# Patient Record
Sex: Male | Born: 1945 | Race: White | Hispanic: No | Marital: Single | Smoking: Never smoker
Health system: Southern US, Community
[De-identification: ages and names within clinical notes are randomized; demographics above are authoritative.]

## PROBLEM LIST (undated history)

## (undated) HISTORY — PX: FRACTURE SURGERY: SHX138

## (undated) HISTORY — PX: TONSILLECTOMY: SUR1361

## (undated) HISTORY — PX: BACK SURGERY: SHX140

## (undated) HISTORY — PX: KNEE SURGERY: SHX244

---

## 2000-05-23 ENCOUNTER — Ambulatory Visit (HOSPITAL_COMMUNITY): Admission: RE | Admit: 2000-05-23 | Discharge: 2000-05-23 | Payer: Self-pay | Admitting: Neurosurgery

## 2000-05-23 ENCOUNTER — Encounter: Payer: Self-pay | Admitting: Neurosurgery

## 2000-06-14 ENCOUNTER — Encounter: Payer: Self-pay | Admitting: Neurosurgery

## 2000-06-19 ENCOUNTER — Ambulatory Visit (HOSPITAL_COMMUNITY): Admission: RE | Admit: 2000-06-19 | Discharge: 2000-06-20 | Payer: Self-pay | Admitting: Neurosurgery

## 2000-06-19 ENCOUNTER — Encounter: Payer: Self-pay | Admitting: Neurosurgery

## 2011-12-07 DIAGNOSIS — M19031 Primary osteoarthritis, right wrist: Secondary | ICD-10-CM | POA: Insufficient documentation

## 2012-01-08 DIAGNOSIS — K219 Gastro-esophageal reflux disease without esophagitis: Secondary | ICD-10-CM | POA: Insufficient documentation

## 2012-01-08 DIAGNOSIS — N529 Male erectile dysfunction, unspecified: Secondary | ICD-10-CM | POA: Insufficient documentation

## 2016-02-17 DIAGNOSIS — M19041 Primary osteoarthritis, right hand: Secondary | ICD-10-CM | POA: Insufficient documentation

## 2016-02-17 DIAGNOSIS — G5601 Carpal tunnel syndrome, right upper limb: Secondary | ICD-10-CM | POA: Insufficient documentation

## 2016-02-28 DEATH — deceased

## 2016-03-14 DIAGNOSIS — M67441 Ganglion, right hand: Secondary | ICD-10-CM | POA: Insufficient documentation

## 2019-12-21 DIAGNOSIS — M170 Bilateral primary osteoarthritis of knee: Secondary | ICD-10-CM | POA: Insufficient documentation

## 2019-12-21 DIAGNOSIS — Z87891 Personal history of nicotine dependence: Secondary | ICD-10-CM | POA: Insufficient documentation

## 2020-06-10 ENCOUNTER — Emergency Department
Admission: RE | Admit: 2020-06-10 | Discharge: 2020-06-10 | Disposition: A | Discharge status: Home / Self Care | Payer: Medicare Other | Source: Ambulatory Visit | Attending: Family Medicine | Admitting: Family Medicine

## 2020-06-10 ENCOUNTER — Emergency Department (INDEPENDENT_AMBULATORY_CARE_PROVIDER_SITE_OTHER): Payer: Medicare Other

## 2020-06-10 ENCOUNTER — Other Ambulatory Visit: Payer: Self-pay

## 2020-06-10 VITALS — BP 161/94 | HR 85 | Temp 99.0°F | Resp 16 | Ht 71.0 in | Wt 200.0 lb

## 2020-06-10 DIAGNOSIS — J069 Acute upper respiratory infection, unspecified: Secondary | ICD-10-CM | POA: Diagnosis not present

## 2020-06-10 DIAGNOSIS — R059 Cough, unspecified: Secondary | ICD-10-CM | POA: Diagnosis not present

## 2020-06-10 DIAGNOSIS — J209 Acute bronchitis, unspecified: Secondary | ICD-10-CM

## 2020-06-10 MED ORDER — DOXYCYCLINE HYCLATE 100 MG PO CAPS: ORAL_CAPSULE | ORAL | 0 refills | Status: DC

## 2020-06-10 NOTE — ED Triage Notes (Addendum)
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

## 2020-06-10 NOTE — ED Provider Notes (Signed)
[facility]    CSN: [id number] Arrival date & time: 06/10/20  1835      History   Chief Complaint Chief Complaint  Patient presents with  . Appointment  . Facial Pain    HPI Thomas Bauer is a 74 y.o. male.   One week ago patient developed sinus congestion and post-nasal drainage, followed by a partly productive cough.  He has had chills/sweats and now has developed mild pain at the bases of his anterior chest.  The history is provided by the patient.    History reviewed. No pertinent past medical history.  Patient Active Problem List   Diagnosis Date Noted  . Personal history of smoking 12/21/2019  . Primary osteoarthritis of both knees 12/21/2019  . Ganglion cyst of flexor tendon sheath of finger of right hand 03/14/2016  . Carpal tunnel syndrome of right wrist 02/17/2016  . Primary localized osteoarthrosis of right hand 02/17/2016  . Erectile dysfunction 01/08/2012  . GERD (gastroesophageal reflux disease) 01/08/2012  . 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 BACK SURGERY    . FRACTURE SURGERY Left    wrist   . FRACTURE SURGERY Right    jaw  . KNEE SURGERY Left   . KNEE SURGERY Right   . TONSILLECTOMY         Home Medications    Prior to Admission medications   Medication Sig Start Date End Date Taking? Authorizing Provider  latanoprost (XALATAN) 0.005 % ophthalmic solution Place 1 drop into both eyes at bedtime. 04/07/20  Yes [provider]  omeprazole (PRILOSEC) 20 MG capsule TAKE 2 CAPSULES BY MOUTH ONCE A DAY 03/30/20  Yes [provider]  sildenafil (REVATIO) 20 MG tablet 2-3 tabs 1 hr before intercourse 12/21/19  Yes [provider]  doxycycline (VIBRAMYCIN) 100 MG capsule Take one cap PO Q12hr with food. 06/10/20 06/23/20  Lattie Haw, MD    Family History Family History  Problem Relation Age of Onset  . Cancer Sister     Social History Social  History   Tobacco Use  . Smoking status: Never Smoker  . Smokeless tobacco: Never Used  Vaping Use  . Vaping Use: Never used  Substance Use Topics  . Alcohol use: Yes    Alcohol/week: 6.0 standard drinks    Types: 6 Cans of beer per week  . Drug use: Never     Allergies   Aspirin   Review of Systems Review of Systems No sore throat + cough ? pleuritic pain; patient complains of pain in anterior lower chest No wheezing + nasal congestion + post-nasal drainage No sinus pain/pressure No itchy/red eyes No earache No hemoptysis No SOB No fever/+ chills,sweats No nausea No vomiting No abdominal pain No diarrhea No urinary symptoms No skin rash + fatigue No myalgias No headache Used OTC meds (Nyquil) without relief   Physical Exam Triage Vital Signs ED Triage Vitals  Enc Vitals Group     BP 06/10/20 1859 (!) 161/94     Pulse Rate 06/10/20 1859 85     Resp 06/10/20 1859 16     Temp 06/10/20 1859 99 F (37.2 C)     Temp Source 06/10/20 1859 Oral     SpO2 06/10/20 1859 97 %     Weight 06/10/20 1904 200 lb (90.7 kg)     Height 06/10/20 1904 5\' 11"  (1.803 m)     Head Circumference --  Peak Flow --      Pain Score 06/10/20 1903 3     Pain Loc --      Pain Edu? --      Excl. in GC? --    No data found.  Updated Vital Signs BP (!) 161/94 (BP Location: Right Arm)   Pulse 85   Temp 99 F (37.2 C) (Oral)   Resp 16   Ht 5\' 11"  (1.803 m)   Wt 90.7 kg   SpO2 97%   BMI 27.89 kg/m   Visual Acuity Right Eye Distance:   Left Eye Distance:   Bilateral Distance:    Right Eye Near:   Left Eye Near:    Bilateral Near:     Physical Exam Nursing notes and Vital Signs reviewed. Appearance:  Patient appears stated age, and in no acute distress Eyes:  Pupils are equal, round, and reactive to light and accomodation.  Extraocular movement is intact.  Conjunctivae are not inflamed  Ears:  Canals normal.  Tympanic membranes normal.  Nose:  Mildly congested  turbinates.  No sinus tenderness.   Pharynx:  Normal Neck:  Supple.  Mildly enlarged lateral nodes are present, tender to palpation on the left.   Lungs:  Clear to auscultation.  Breath sounds are equal.  Moving air well. Heart:  Regular rate and rhythm without murmurs, rubs, or gallops.  Abdomen:  Nontender without masses or hepatosplenomegaly.  Bowel sounds are present.  No CVA or flank tenderness.  Extremities:  No edema.  Skin:  No rash present.   UC Treatments / Results  Labs (all labs ordered are listed, but only abnormal results are displayed) Labs Reviewed - No data to display  EKG   Radiology DG Chest 2 View  Result Date: 06/10/2020 CLINICAL DATA:  URI chest pain and cough EXAM: CHEST - 2 VIEW COMPARISON:  None. FINDINGS: Mild bronchitic changes. No consolidation or effusion. Normal heart size. Aortic atherosclerosis. Suspected thin walled cyst or large bulla in the peripheral left upper lobe. IMPRESSION: Mild bronchitic changes. No focal pulmonary infiltrate. Suspected large bulla or thin walled cyst in the left upper lobe. Electronically Signed   By: 13/06/2020 M.D.   On: 06/10/2020 20:33    Procedures Procedures (including critical care time)  Medications Ordered in UC Medications - No data to display  Initial Impression / Assessment and Plan / UC Course  I have reviewed the triage vital signs and the nursing notes.  Pertinent labs & imaging results that were available during my care of the patient were reviewed by me and considered in my medical decision making (see chart for details).    Begin doxycycline. Followup with Family Doctor if not improved in about 10 days.   Final Clinical Impressions(s) / UC Diagnoses   Final diagnoses:  Acute bronchitis, unspecified organism     Discharge Instructions     Take plain guaifenesin (1200mg  extended release tabs such as Mucinex) twice daily, with plenty of water, for cough and congestion.  May add  Pseudoephedrine (30mg , one or two every 4 to 6 hours) for sinus congestion.  Get adequate rest.   May use Afrin nasal spray (or generic oxymetazoline) each morning for about 5 days and then discontinue.  Also recommend using saline nasal spray several times daily and saline nasal irrigation (AYR is a common brand).  Use Flonase nasal spray each morning after using Afrin nasal spray and saline nasal irrigation. Try warm salt water gargles for sore throat.  Stop all antihistamines (Nyquil, etc) for now, and other non-prescription cough/cold preparations. May take Ibuprofen 200mg , 4 tabs every 8 hours with food body aches, headache, etc. May take Delsym Cough Suppressant ("12 Hour Cough Relief") at bedtime for nighttime cough.     ED Prescriptions    Medication Sig Dispense Auth. Provider   doxycycline (VIBRAMYCIN) 100 MG capsule Take one cap PO Q12hr with food. 20 capsule , MD        Lattie Haw, MD 06/12/20 2019

## 2020-06-10 NOTE — Discharge Instructions (Addendum)
Take plain guaifenesin (1200mg  extended release tabs such as Mucinex) twice daily, with plenty of water, for cough and congestion.  May add Pseudoephedrine (30mg , one or two every 4 to 6 hours) for sinus congestion.  Get adequate rest.   May use Afrin nasal spray (or generic oxymetazoline) each morning for about 5 days and then discontinue.  Also recommend using saline nasal spray several times daily and saline nasal irrigation (AYR is a common brand).  Use Flonase nasal spray each morning after using Afrin nasal spray and saline nasal irrigation. Try warm salt water gargles for sore throat.  Stop all antihistamines (Nyquil, etc) for now, and other non-prescription cough/cold preparations. May take Ibuprofen 200mg , 4 tabs every 8 hours with food body aches, headache, etc. May take Delsym Cough Suppressant ("12 Hour Cough Relief") at bedtime for nighttime cough.

## 2020-06-16 ENCOUNTER — Encounter: Payer: Self-pay | Admitting: Emergency Medicine

## 2020-06-16 ENCOUNTER — Other Ambulatory Visit: Payer: Self-pay

## 2020-06-16 ENCOUNTER — Emergency Department
Admission: EM | Admit: 2020-06-16 | Discharge: 2020-06-16 | Disposition: A | Discharge status: Home / Self Care | Payer: Medicare Other | Source: Home / Self Care

## 2020-06-16 DIAGNOSIS — M109 Gout, unspecified: Secondary | ICD-10-CM

## 2020-06-16 MED ORDER — COLCHICINE 0.6 MG PO TABS: ORAL_TABLET | ORAL | 0 refills | Status: AC

## 2020-06-16 MED ORDER — PREDNISONE 50 MG PO TABS: 50.0000 mg | ORAL_TABLET | Freq: Every day | ORAL | 0 refills | Status: AC

## 2020-06-16 MED ORDER — HYDROCODONE-ACETAMINOPHEN 5-325 MG PO TABS
1.0000 | ORAL_TABLET | Freq: Four times a day (QID) | ORAL | 0 refills | Status: AC | PRN
Start: 2020-06-16 — End: ?

## 2020-06-16 NOTE — ED Provider Notes (Signed)
[facility]    CSN: [id number] Arrival date & time: 06/16/20  1015      History   Chief Complaint Chief Complaint  Patient presents with  . Gout    HPI Thomas Bauer is a 74 y.o. male.   HPI Thomas Bauer is a 74 y.o. male presenting to UC with c/o 3.5 days of gradually worsening aching pain in his Left great toe. Associated redness. Pain is worst in the joint. Hx of gout years ago. He cannot recall if pain is similar. Denies recent change in diet. He is currently taking doxycycline for bronchitis but no other medication changes. Denies injury.  History reviewed. No pertinent past medical history.  Patient Active Problem List   Diagnosis Date Noted  . Personal history of smoking 12/21/2019  . Primary osteoarthritis of both knees 12/21/2019  . Ganglion cyst of flexor tendon sheath of finger of right hand 03/14/2016  . Carpal tunnel syndrome of right wrist 02/17/2016  . Primary localized osteoarthrosis of right hand 02/17/2016  . Erectile dysfunction 01/08/2012  . GERD (gastroesophageal reflux disease) 01/08/2012  . 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 BACK SURGERY    . FRACTURE SURGERY Left    wrist   . FRACTURE SURGERY Right    jaw  . KNEE SURGERY Left   . KNEE SURGERY Right   . TONSILLECTOMY         Home Medications    Prior to Admission medications   Medication Sig Start Date End Date Taking? Authorizing Provider  colchicine 0.6 MG tablet Take 2 tabs once (1.2mg ) followed 1 hour by 1 tab (0.6mg ) 06/16/20   Lurene Shadow, PA-C  HYDROcodone-acetaminophen (NORCO/VICODIN) 5-325 MG tablet Take 1 tablet by mouth every 6 (six) hours as needed. 06/16/20   Lurene Shadow, PA-C  latanoprost (XALATAN) 0.005 % ophthalmic solution Place 1 drop into both eyes at bedtime. 04/07/20   [provider]  omeprazole (PRILOSEC) 20 MG capsule TAKE 2 CAPSULES BY MOUTH ONCE A DAY 03/30/20   [provider]  predniSONE (DELTASONE) 50 MG tablet Take 1 tablet (50 mg total) by mouth daily with breakfast for 3 days. 06/16/20 06/19/20  Lurene Shadow, PA-C  sildenafil (REVATIO) 20 MG tablet 2-3 tabs 1 hr before intercourse 12/21/19   [provider]    Family History Family History  Problem Relation Age of Onset  . Cancer Sister     Social History Social History   Tobacco Use  . Smoking status: Never Smoker  . Smokeless tobacco: Never Used  Vaping Use  . Vaping Use: Never used  Substance Use Topics  . Alcohol use: Yes    Alcohol/week: 6.0 standard drinks    Types: 6 Cans of beer per week  . Drug use: Never     Allergies   Aspirin   Review of Systems Review of Systems  Musculoskeletal: Positive for arthralgias and joint swelling.  Skin: Positive for color change. Negative for wound.     Physical Exam Triage Vital Signs ED Triage Vitals  Enc Vitals Group     BP 06/16/20 1028 (!) 167/83     Pulse Rate 06/16/20 1028 88     Resp --      Temp 06/16/20 1028 98.6 F (37 C)     Temp Source 06/16/20 1028 Oral     SpO2 06/16/20 1028 95 %     Weight 06/16/20  1029 200 lb (90.7 kg)     Height 06/16/20 1029 5\' 11"  (1.803 m)     Head Circumference --      Peak Flow --      Pain Score 06/16/20 1028 10     Pain Loc --      Pain Edu? --      Excl. in GC? --    No data found.  Updated Vital Signs BP (!) 167/83 (BP Location: Right Arm)   Pulse 88   Temp 98.6 F (37 C) (Oral)   Ht 5\' 11"  (1.803 m)   Wt 200 lb (90.7 kg)   SpO2 95%   BMI 27.89 kg/m   Visual Acuity Right Eye Distance:   Left Eye Distance:   Bilateral Distance:    Right Eye Near:   Left Eye Near:    Bilateral Near:     Physical Exam Vitals and nursing note reviewed.  Constitutional:      Appearance: Normal appearance. He is well-developed.  HENT:     Head: Normocephalic and atraumatic.  Cardiovascular:     Rate and Rhythm: Normal rate.  Pulmonary:     Effort: Pulmonary  effort is normal.  Musculoskeletal:        General: Swelling and tenderness present. Normal range of motion.     Cervical back: Normal range of motion.     Comments: Left great toe: mild edema at first metatarsal joint, tenderness.   Skin:    General: Skin is warm and dry.     Capillary Refill: Capillary refill takes less than 2 seconds.     Findings: Erythema present.     Comments: Left great toe: skin in tact. Erythema and warmth over first metatarsal joint. Tenderness.   Neurological:     Mental Status: He is alert and oriented to person, place, and time.     Sensory: No sensory deficit.  Psychiatric:        Behavior: Behavior normal.      UC Treatments / Results  Labs (all labs ordered are listed, but only abnormal results are displayed) Labs Reviewed - No data to display  EKG   Radiology No results found.  Procedures Procedures (including critical care time)  Medications Ordered in UC Medications - No data to display  Initial Impression / Assessment and Plan / UC Course  I have reviewed the triage vital signs and the nursing notes.  Pertinent labs & imaging results that were available during my care of the patient were reviewed by me and considered in my medical decision making (see chart for details).     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 While taking, do not drink alcohol, drive, or perform any other activities that requires focus while taking these medications.   You have been prescribed 3 days of prednisone to help with inflammation and pain. Be sure to take this medication with food to help prevent stomach upset or ulcer.   Call to schedule a follow up  appointment with primary care next week if not improving.    ED Prescriptions    Medication Sig Dispense Auth. Provider   colchicine 0.6 MG tablet Take 2 tabs once (1.2mg ) followed 1  hour by 1 tab (0.6mg ) 3 tablet Lurene Shadow, PA-C   HYDROcodone-acetaminophen (NORCO/VICODIN) 5-325 MG tablet Take 1 tablet by mouth every 6 (six) hours as needed. 6 tablet Doroteo Glassman, Santiago Graf O, PA-C   predniSONE (DELTASONE) 50 MG tablet Take 1 tablet (50 mg total) by mouth daily with breakfast for 3 days. 3 tablet Lurene Shadow, PA-C     I have reviewed the PDMP during this encounter.   Lurene Shadow, PA-C 06/16/20 1049

## 2020-06-16 NOTE — Discharge Instructions (Signed)
  Norco/Vicodin (hydrocodone-acetaminophen) is a narcotic pain medication, do not combine these medications with others containing tylenol. While taking, do not drink alcohol, drive, or perform any other activities that requires focus while taking these medications.   You have been prescribed 3 days of prednisone to help with inflammation and pain. Be sure to take this medication with food to help prevent stomach upset or ulcer.   Call to schedule a follow up appointment with primary care next week if not improving.

## 2020-06-16 NOTE — ED Triage Notes (Signed)
Gout in left foot x 3.5 days Unvaccinated

## 2021-01-25 IMAGING — DX DG CHEST 2V
2 series · 2 of 2 positions shown · non-contrast
Comparison: None.

CLINICAL DATA: URI chest pain and cough

EXAM:
CHEST - 2 VIEW

[chest pa]
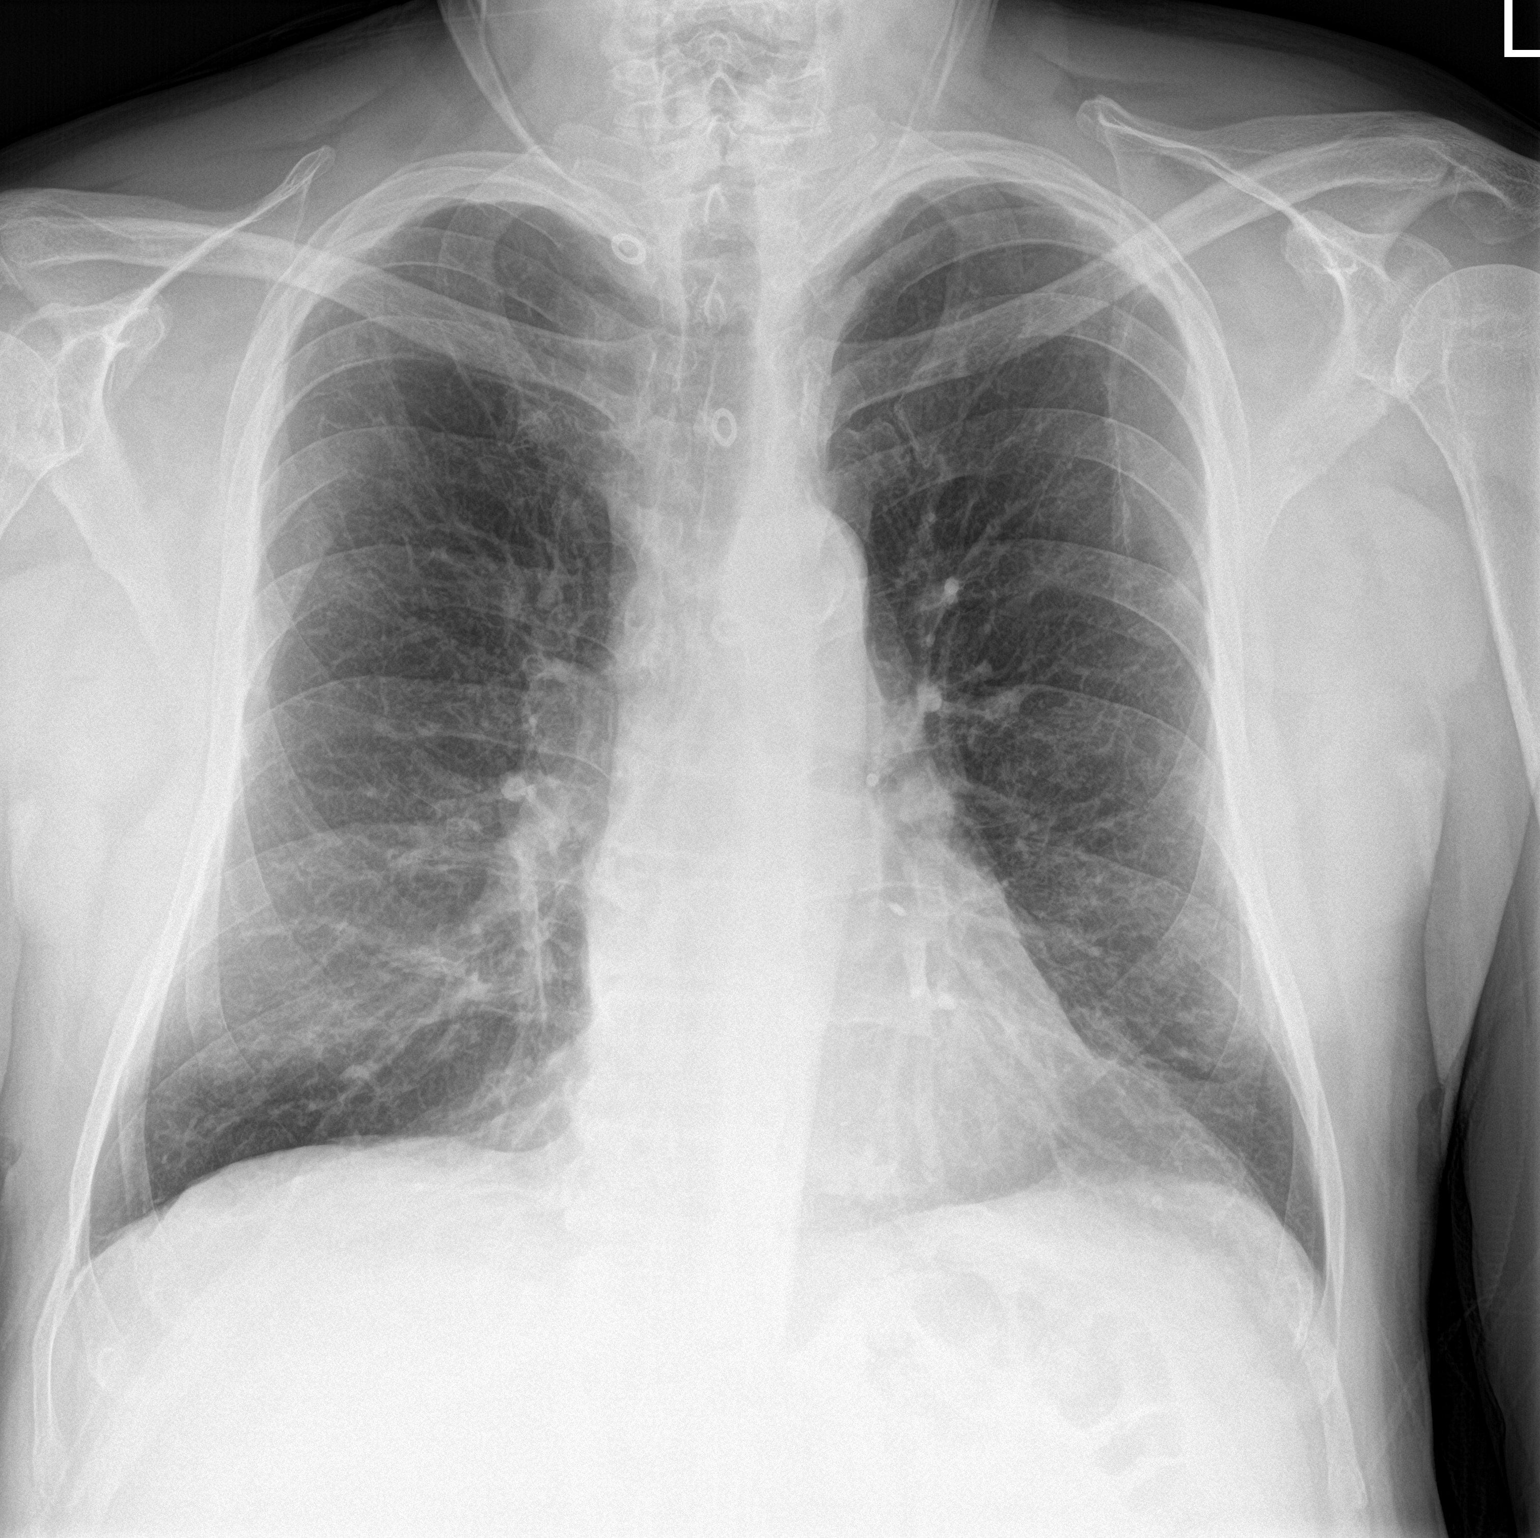

[chest lat]
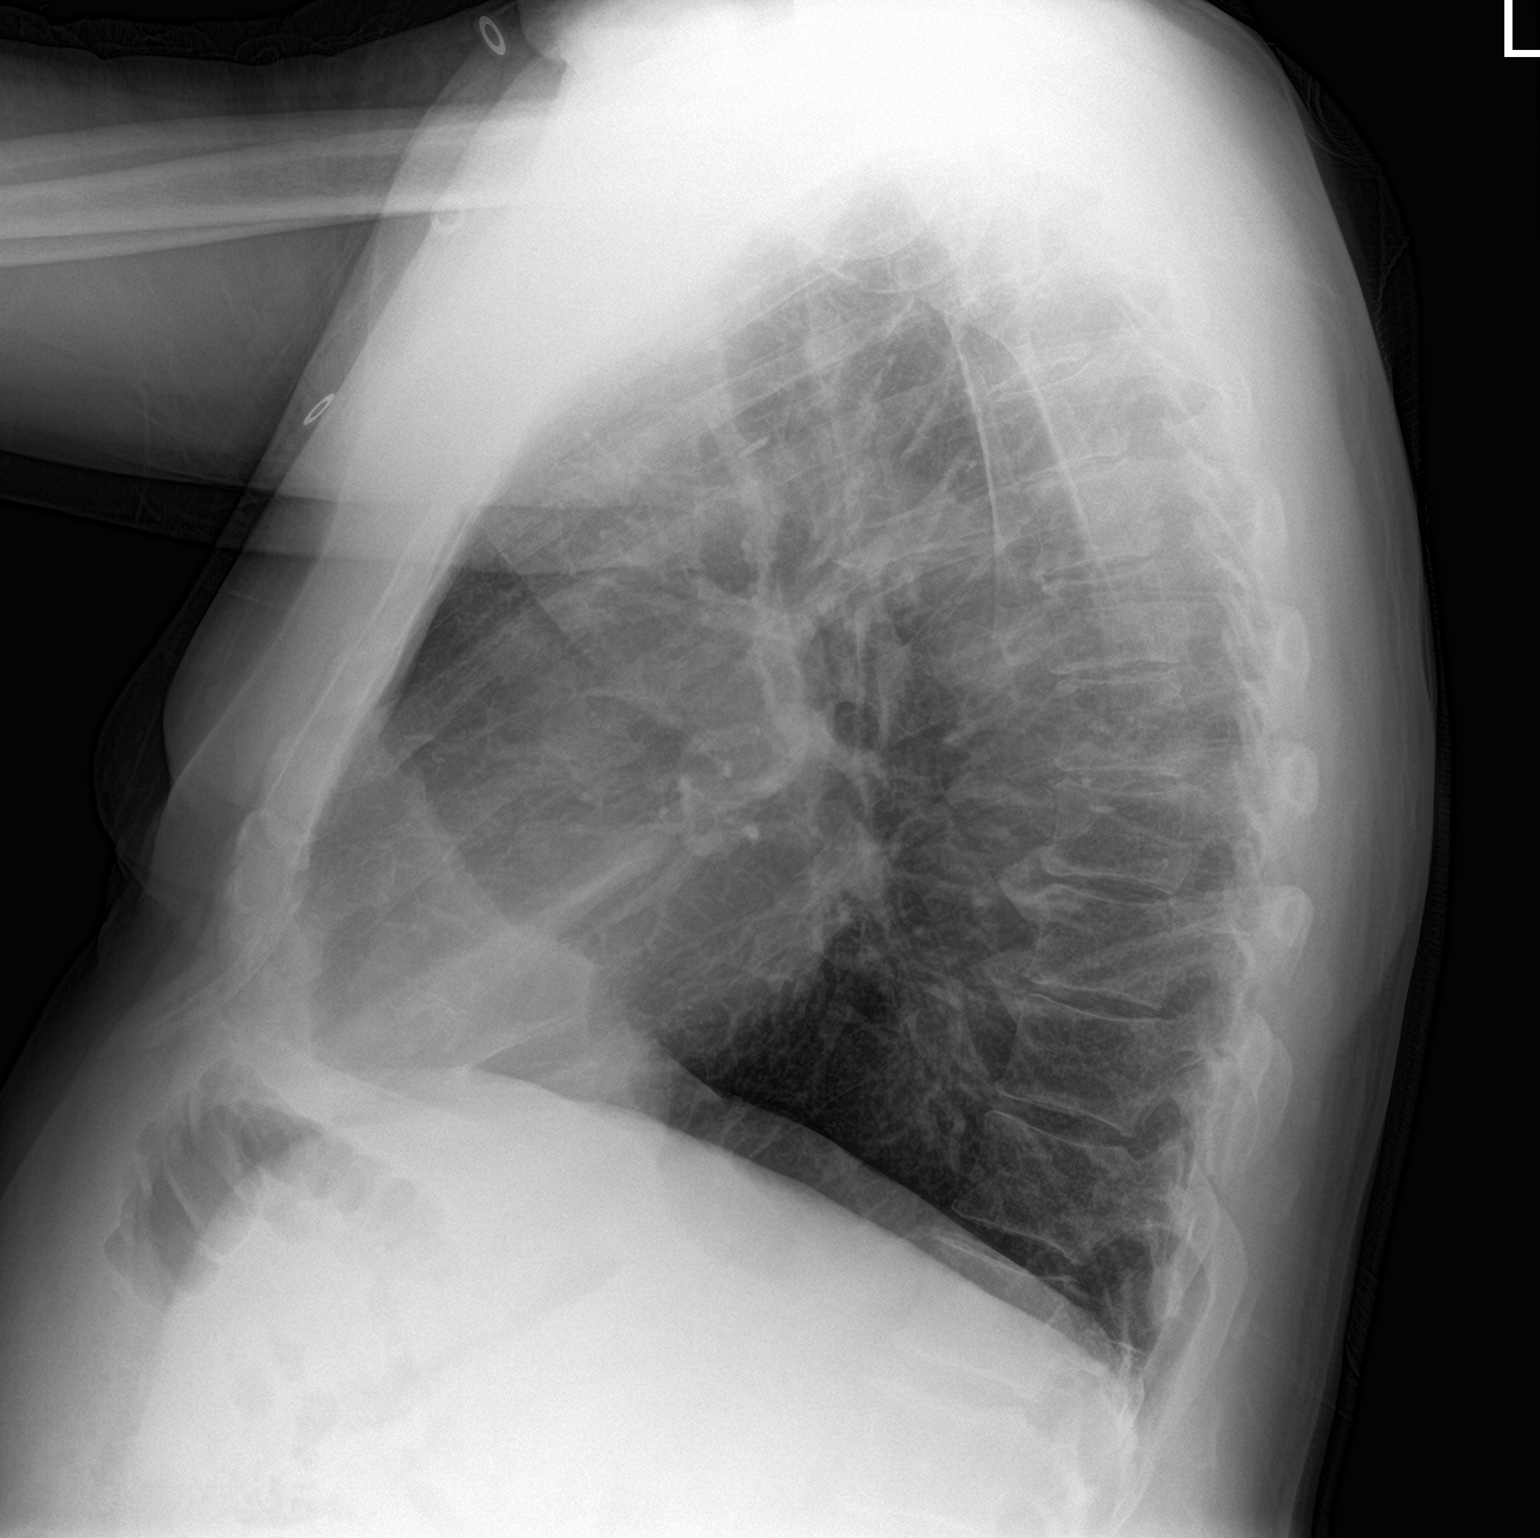

[2 of 2 positions shown; findings below may reference images not displayed]

FINDINGS: Mild bronchitic changes. No consolidation or effusion. Normal heart
size. Aortic atherosclerosis. Suspected thin walled cyst or large
bulla in the peripheral left upper lobe.
IMPRESSION: Mild bronchitic changes. No focal pulmonary infiltrate. Suspected
large bulla or thin walled cyst in the left upper lobe.
